# Patient Record
Sex: Female | Born: 1965 | Race: Black or African American | Hispanic: No | Marital: Single | Smoking: Current every day smoker
Health system: Southern US, Community
[De-identification: ages and names within clinical notes are randomized; demographics above are authoritative.]

## PROBLEM LIST (undated history)

## (undated) DIAGNOSIS — D649 Anemia, unspecified: Secondary | ICD-10-CM

## (undated) HISTORY — PX: TUBAL LIGATION: SHX77

---

## 2005-08-04 ENCOUNTER — Emergency Department (HOSPITAL_COMMUNITY): Admission: EM | Admit: 2005-08-04 | Discharge: 2005-08-04 | Payer: Self-pay | Admitting: Emergency Medicine

## 2006-04-14 ENCOUNTER — Emergency Department (HOSPITAL_COMMUNITY): Admission: EM | Admit: 2006-04-14 | Discharge: 2006-04-14 | Payer: Self-pay | Admitting: Emergency Medicine

## 2007-09-20 ENCOUNTER — Emergency Department (HOSPITAL_COMMUNITY): Admission: EM | Admit: 2007-09-20 | Discharge: 2007-09-20 | Payer: Self-pay | Admitting: Emergency Medicine

## 2008-07-24 ENCOUNTER — Encounter: Payer: Self-pay | Admitting: Emergency Medicine

## 2008-07-24 ENCOUNTER — Inpatient Hospital Stay (HOSPITAL_COMMUNITY): Admission: AD | Admit: 2008-07-24 | Discharge: 2008-07-26 | Payer: Self-pay | Admitting: Internal Medicine

## 2009-10-31 ENCOUNTER — Emergency Department (HOSPITAL_COMMUNITY): Admission: EM | Admit: 2009-10-31 | Discharge: 2009-10-31 | Payer: Self-pay | Admitting: Emergency Medicine

## 2010-02-11 ENCOUNTER — Encounter (INDEPENDENT_AMBULATORY_CARE_PROVIDER_SITE_OTHER): Payer: Self-pay | Admitting: *Deleted

## 2010-02-12 ENCOUNTER — Ambulatory Visit: Payer: Self-pay | Admitting: Gastroenterology

## 2010-02-12 DIAGNOSIS — R198 Other specified symptoms and signs involving the digestive system and abdomen: Secondary | ICD-10-CM | POA: Insufficient documentation

## 2010-02-12 DIAGNOSIS — K625 Hemorrhage of anus and rectum: Secondary | ICD-10-CM

## 2010-02-12 DIAGNOSIS — K5909 Other constipation: Secondary | ICD-10-CM

## 2010-02-25 ENCOUNTER — Ambulatory Visit: Payer: Self-pay | Admitting: Gastroenterology

## 2010-02-25 ENCOUNTER — Ambulatory Visit (HOSPITAL_COMMUNITY): Admission: RE | Admit: 2010-02-25 | Discharge: 2010-02-25 | Payer: Self-pay | Admitting: Gastroenterology
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
## 2010-06-02 NOTE — Letter (Signed)
Summary: Scheduled Appointment  [facility]  [address]   [place], [place] [address]   Phone: [phone]  Fax: [phone]    February 11, 2010   Dear: Wendy French            DOB: Jun 02, 1965    I have been instructed to schedule you an appointment in our office.  Your appointment is as follows:   Date: THURSDAY 02/12/10   Time: 2: 30 PM Please be here 15 minutes early.       Provider: Tana Coast, PA-C    Please contact the office if you need to reschedule this appointment for a more convenient time.   Thank you,    Rexene Alberts       [facility] Ph: [phone]   Fax: [phone]

## 2010-06-02 NOTE — Letter (Signed)
Summary: TCS ORDER  TCS ORDER   Imported By: Ave Filter 02/12/2010 16:16:34  _____________________________________________________________________  External Attachment:    Type:   Image     Comment:   External Document
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
## 2010-07-15 LAB — GLUCOSE, CAPILLARY: Glucose-Capillary: 126 mg/dL — ABNORMAL HIGH (ref 70–99)

## 2010-08-13 LAB — BASIC METABOLIC PANEL
BUN: 5 mg/dL — ABNORMAL LOW (ref 6–23)
BUN: 8 mg/dL (ref 6–23)
Chloride: 106 mEq/L (ref 96–112)
GFR calc Af Amer: >60 mL/min (ref >60)
GFR calc Af Amer: >60 mL/min (ref >60)
GFR calc Af Amer: >60 mL/min (ref >60)
GFR calc non Af Amer: >60 mL/min (ref >60)
GFR calc non Af Amer: >60 mL/min (ref >60)
Potassium: 4.1 mEq/L (ref 3.5–5.1)
Potassium: 4.1 mEq/L (ref 3.5–5.1)
Sodium: 136 mEq/L (ref 135–145)

## 2010-08-13 LAB — CBC
HCT: 30.8 % — ABNORMAL LOW (ref 36.0–46.0)
Hemoglobin: 9.3 g/dL — ABNORMAL LOW (ref 12.0–15.0)
Hemoglobin: 9.8 g/dL — ABNORMAL LOW (ref 12.0–15.0)
MCHC: 32.6 g/dL (ref 30.0–36.0)
MCHC: 31.9 g/dL (ref 30.0–36.0)
MCV: 73.4 fL — ABNORMAL LOW (ref 78.0–100.0)
MCV: 74.7 fL — ABNORMAL LOW (ref 78.0–100.0)
Platelets: 364 10*3/uL (ref 150–400)
Platelets: 282 10*3/uL (ref 150–400)
RBC: 3.86 MIL/uL — ABNORMAL LOW (ref 3.87–5.11)
RBC: 4.12 MIL/uL (ref 3.87–5.11)
WBC: 8.4 10*3/uL (ref 4.0–10.5)
WBC: 6.4 10*3/uL (ref 4.0–10.5)

## 2010-08-13 LAB — CULTURE, BLOOD (ROUTINE X 2)
Culture: NO GROWTH
Culture: NO GROWTH

## 2010-08-13 LAB — HEMOGLOBIN A1C: Hgb A1c MFr Bld: 7.3 % — ABNORMAL HIGH (ref 4.6–6.1)

## 2010-08-13 LAB — GLUCOSE, CAPILLARY
Glucose-Capillary: 119 mg/dL — ABNORMAL HIGH (ref 70–99)
Glucose-Capillary: 142 mg/dL — ABNORMAL HIGH (ref 70–99)
Glucose-Capillary: 149 mg/dL — ABNORMAL HIGH (ref 70–99)
Glucose-Capillary: 153 mg/dL — ABNORMAL HIGH (ref 70–99)
Glucose-Capillary: 170 mg/dL — ABNORMAL HIGH (ref 70–99)

## 2010-08-13 LAB — HIV ANTIBODY (ROUTINE TESTING W REFLEX): HIV: NONREACTIVE

## 2010-08-13 LAB — DIFFERENTIAL
Basophils Relative: 0 % (ref 0–1)
Eosinophils Absolute: 0.3 10*3/uL (ref 0.0–0.7)
Eosinophils Relative: 3 % (ref 0–5)
Monocytes Relative: 3 % (ref 3–12)
Neutrophils Relative %: 66 % (ref 43–77)

## 2010-08-13 LAB — PREGNANCY, URINE: Preg Test, Ur: NEGATIVE

## 2010-08-13 LAB — PROTIME-INR
INR: 1 (ref 0.00–1.49)
Prothrombin Time: 13.1 seconds (ref 11.6–15.2)

## 2010-09-11 ENCOUNTER — Emergency Department (HOSPITAL_COMMUNITY)
Admission: EM | Admit: 2010-09-11 | Discharge: 2010-09-11 | Disposition: A | Discharge status: Home / Self Care | Payer: Medicaid Other | Attending: Emergency Medicine | Admitting: Emergency Medicine

## 2010-09-11 DIAGNOSIS — R51 Headache: Secondary | ICD-10-CM | POA: Insufficient documentation

## 2010-09-11 DIAGNOSIS — L708 Other acne: Secondary | ICD-10-CM | POA: Insufficient documentation

## 2010-09-11 DIAGNOSIS — L659 Nonscarring hair loss, unspecified: Secondary | ICD-10-CM | POA: Insufficient documentation
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
## 2011-01-27 LAB — CBC
Hemoglobin: 9.1 — ABNORMAL LOW
MCV: 78.3
Platelets: 286
RDW: 15.6 — ABNORMAL HIGH

## 2011-01-27 LAB — HCG, QUANTITATIVE, PREGNANCY: hCG, Beta Chain, Quant, S: <2

## 2012-01-10 ENCOUNTER — Emergency Department (HOSPITAL_COMMUNITY)
Admission: EM | Admit: 2012-01-10 | Discharge: 2012-01-10 | Discharge status: Against Medical Advice | Payer: Medicaid Other | Attending: Emergency Medicine | Admitting: Emergency Medicine

## 2012-01-10 ENCOUNTER — Encounter (HOSPITAL_COMMUNITY): Payer: Self-pay | Admitting: *Deleted

## 2012-01-10 DIAGNOSIS — K5900 Constipation, unspecified: Secondary | ICD-10-CM | POA: Insufficient documentation

## 2012-01-10 NOTE — ED Notes (Signed)
No bm in 3 days, took stool softner and gave herself an enema with achildren's bulb syringe, and caused bleeding.

## 2012-01-10 NOTE — ED Notes (Signed)
The patient states she had a bowel movement and no longer wishes treatment, ama form signed

## 2015-01-27 ENCOUNTER — Encounter: Payer: Self-pay | Admitting: Gastroenterology

## 2015-02-06 ENCOUNTER — Ambulatory Visit (INDEPENDENT_AMBULATORY_CARE_PROVIDER_SITE_OTHER): Payer: Medicaid Other | Admitting: Otolaryngology

## 2015-02-12 ENCOUNTER — Ambulatory Visit: Payer: Medicaid Other | Admitting: Gastroenterology

## 2015-02-12 ENCOUNTER — Encounter: Payer: Self-pay | Admitting: Gastroenterology

## 2015-02-12 ENCOUNTER — Telehealth: Payer: Self-pay | Admitting: Gastroenterology

## 2015-02-12 NOTE — Telephone Encounter (Signed)
PATIENT WAS A NO SHOW AND LETTER SENT  °

## 2015-03-06 ENCOUNTER — Ambulatory Visit (INDEPENDENT_AMBULATORY_CARE_PROVIDER_SITE_OTHER): Payer: Medicaid Other | Admitting: Otolaryngology

## 2015-07-21 ENCOUNTER — Encounter: Payer: Medicaid Other | Admitting: Obstetrics & Gynecology

## 2016-05-11 ENCOUNTER — Encounter (HOSPITAL_COMMUNITY): Payer: Self-pay | Admitting: Emergency Medicine

## 2016-05-11 ENCOUNTER — Emergency Department (HOSPITAL_COMMUNITY)
Admission: EM | Admit: 2016-05-11 | Discharge: 2016-05-11 | Disposition: A | Discharge status: Home / Self Care | Payer: Self-pay | Attending: Emergency Medicine | Admitting: Emergency Medicine

## 2016-05-11 ENCOUNTER — Emergency Department (HOSPITAL_COMMUNITY): Payer: Self-pay

## 2016-05-11 DIAGNOSIS — E119 Type 2 diabetes mellitus without complications: Secondary | ICD-10-CM | POA: Insufficient documentation

## 2016-05-11 DIAGNOSIS — Z79899 Other long term (current) drug therapy: Secondary | ICD-10-CM | POA: Insufficient documentation

## 2016-05-11 DIAGNOSIS — M7989 Other specified soft tissue disorders: Secondary | ICD-10-CM | POA: Insufficient documentation

## 2016-05-11 DIAGNOSIS — R079 Chest pain, unspecified: Secondary | ICD-10-CM | POA: Insufficient documentation

## 2016-05-11 DIAGNOSIS — Z7984 Long term (current) use of oral hypoglycemic drugs: Secondary | ICD-10-CM | POA: Insufficient documentation

## 2016-05-11 DIAGNOSIS — F17200 Nicotine dependence, unspecified, uncomplicated: Secondary | ICD-10-CM | POA: Insufficient documentation

## 2016-05-11 HISTORY — DX: Anemia, unspecified: D64.9

## 2016-05-11 LAB — BASIC METABOLIC PANEL
Anion gap: 5 (ref 5–15)
BUN: 8 mg/dL (ref 6–20)
CO2: 28 mmol/L (ref 22–32)
Calcium: 8.9 mg/dL (ref 8.9–10.3)
Chloride: 104 mmol/L (ref 101–111)
Creatinine, Ser: 0.66 mg/dL (ref 0.44–1.00)
GFR calc Af Amer: >60 mL/min (ref >60)
GFR calc non Af Amer: >60 mL/min (ref >60)
Glucose, Bld: 81 mg/dL (ref 65–99)
Potassium: 3.8 mmol/L (ref 3.5–5.1)
Sodium: 137 mmol/L (ref 135–145)

## 2016-05-11 LAB — CBC
HCT: 28.5 % — ABNORMAL LOW (ref 36.0–46.0)
Hemoglobin: 8.7 g/dL — ABNORMAL LOW (ref 12.0–15.0)
MCH: 21.7 pg — ABNORMAL LOW (ref 26.0–34.0)
MCHC: 30.5 g/dL (ref 30.0–36.0)
MCV: 71.1 fL — ABNORMAL LOW (ref 78.0–100.0)
Platelets: 266 10*3/uL (ref 150–400)
RBC: 4.01 MIL/uL (ref 3.87–5.11)
RDW: 21.5 % — ABNORMAL HIGH (ref 11.5–15.5)
WBC: 5.4 10*3/uL (ref 4.0–10.5)

## 2016-05-11 LAB — TROPONIN I: Troponin I: <0.03 ng/mL (ref <0.03)

## 2016-05-11 MED ORDER — LORAZEPAM 2 MG/ML IJ SOLN
0.5000 mg | Freq: Once | INTRAMUSCULAR | Status: AC
  Administered 2016-05-11: 0.5 mg via INTRAVENOUS
  Filled 2016-05-11: qty 1

## 2016-05-11 MED ORDER — KETOROLAC TROMETHAMINE 30 MG/ML IJ SOLN
15.0000 mg | Freq: Once | INTRAMUSCULAR | Status: AC
  Administered 2016-05-11: 15 mg via INTRAVENOUS
  Filled 2016-05-11: qty 1

## 2016-05-11 MED ORDER — MELOXICAM 7.5 MG PO TABS: 7.5000 mg | ORAL_TABLET | Freq: Every day | ORAL | 0 refills | Status: DC | PRN

## 2016-05-11 NOTE — ED Triage Notes (Addendum)
Patient complaining of right sided chest pain "for about 3 months." States "it hurts when I take a deep breath especially when I smoke." Denies pain at triage.

## 2016-05-11 NOTE — ED Notes (Signed)
ED Provider at bedside. 

## 2016-05-11 NOTE — ED Provider Notes (Signed)
[facility] DEPT Provider Note   CSN: [id number] Arrival date & time: 05/11/16  1008   By signing my name below, I, Cynda Acres, attest that this documentation has been prepared under the direction and in the presence of Raeford Razor, MD. Electronically Signed: Cynda Acres, Scribe. 05/11/16. 11:41 AM.   History   Chief Complaint Chief Complaint  Patient presents with  . Chest Pain    HPI Comments: Wendy French is a 51 y.o. female hx of DM and tobacco abuse, who presents to the Emergency Department complaining of intermittent right-sided chest pain that began 3 months ago. Patient states her episodes last about an hour, she has to go lay down until pain resolves. She has associated lower extremity edema. Patient states it hurts worse with deep breathing, especially when smoking. Patient describes the pain as "crampy". No modifying factors indicated. She denies any pain when coughing, laughing, or sneezing, no back pain, fever, chills, or night sweats.   The history is provided by the patient. No language interpreter was used.    Past Medical History:  Diagnosis Date  . Anemia   . Diabetes mellitus     Patient Active Problem List   Diagnosis Date Noted  . CONSTIPATION, CHRONIC 02/12/2010  . RECTAL BLEEDING 02/12/2010  . RECTAL MASS 02/12/2010    Past Surgical History:  Procedure Laterality Date  . TUBAL LIGATION      OB History    No data available       Home Medications    Prior to Admission medications   Not on File    Family History History reviewed. No pertinent family history.  Social History Social History  Substance Use Topics  . Smoking status: Current Every Day Smoker  . Smokeless tobacco: Never Used  . Alcohol use Yes     Comment: "about 2 beers every other day"     Allergies   Patient has no known allergies.   Review of Systems Review of Systems  Constitutional: Negative for chills and diaphoresis.  Cardiovascular:  Positive for chest pain and leg swelling.  Musculoskeletal: Negative for back pain.  All other systems reviewed and are negative.    Physical Exam Updated Vital Signs BP 131/90 (BP Location: Left Arm)   Pulse 83   Temp 97.3 F (36.3 C) (Temporal)   Resp 16   Ht 5\' 3"  (1.6 m)   Wt 205 lb (93 kg)   LMP 05/04/2016   SpO2 100%   BMI 36.31 kg/m   Physical Exam  Constitutional: She is oriented to person, place, and time. She appears well-developed and well-nourished. No distress.  HENT:  Head: Normocephalic and atraumatic.  Eyes: EOM are normal.  Neck: Normal range of motion.  Cardiovascular: Normal rate, regular rhythm and normal heart sounds.   Pulmonary/Chest: Effort normal and breath sounds normal.  Abdominal: Soft. She exhibits no distension. There is no tenderness.  Musculoskeletal: Normal range of motion.  Neurological: She is alert and oriented to person, place, and time.  Skin: Skin is warm and dry.  Psychiatric: She has a normal mood and affect. Judgment normal.  Nursing note and vitals reviewed.    ED Treatments / Results  DIAGNOSTIC STUDIES: Oxygen Saturation is 100% on RA, normal by my interpretation.    COORDINATION OF CARE: 11:40 AM Discussed treatment plan with pt at bedside and pt agreed to plan.  Labs (all labs ordered are listed, but only abnormal results are displayed) Labs Reviewed  CBC - Abnormal; Notable  for the following:       Result Value   Hemoglobin 8.7 (*)    HCT 28.5 (*)    MCV 71.1 (*)    MCH 21.7 (*)    RDW 21.5 (*)    All other components within normal limits  BASIC METABOLIC PANEL  TROPONIN I    EKG  EKG Interpretation  Date/Time:  Tuesday May 11 2016 11:10:05 EST Ventricular Rate:  70 PR Interval:    QRS Duration: 84 QT Interval:  390 QTC Calculation: 421 R Axis:   22 Text Interpretation:  Sinus rhythm Anterior infarct, old Non-specific ST-t changes No old tracing to compare Confirmed by Emannuel Vise  MD, Oneita Allmon [phone]  on 05/11/2016 11:50:07 AM       Radiology No results found.   Dg Chest 2 View  Result Date: 05/11/2016 CLINICAL DATA:  Intermittent right-sided chest pain that began 3 months ago. Also lower extremity edema. History of diabetes, current smoker. EXAM: CHEST  2 VIEW COMPARISON:  PA and lateral chest x-ray of July 24, 2008 FINDINGS: The lungs are well-expanded. The interstitial markings are mildly increased overall as compared to the previous study. The heart is top-normal in size. The pulmonary vascularity is not engorged. The mediastinum is normal in width. There are stable calcifications in the hilar regions. The bony thorax exhibits no acute abnormality. There are deformities of the posterior aspects of the right fifth and sixth ribs which have appeared since the previous study likely reflect previous fractures. IMPRESSION: Mild interstitial prominence more conspicuous than in the past may reflect chronic bronchitic change. There is no alveolar pneumonia nor CHF. There are fractures of the posterior aspects of the right fifth and sixth ribs which have appeared since the 2010 study but appear old. Electronically Signed   By: David  SwazilandJordan M.D.   On: 05/11/2016 11:59    Procedures Procedures (including critical care time)  Medications Ordered in ED Medications - No data to display   Initial Impression / Assessment and Plan / ED Course  I have reviewed the triage vital signs and the nursing notes.  Pertinent labs & imaging results that were available during my care of the patient were reviewed by me and considered in my medical decision making (see chart for details).  Clinical Course     50yF with CP. Doubt ACE, PE, dissection or other emergent process.  Final Clinical Impressions(s) / ED Diagnoses   Final diagnoses:  Chest pain, unspecified type    New Prescriptions New Prescriptions   No medications on file   I personally preformed the services scribed in my presence. The  recorded information has been reviewed is accurate. Raeford RazorStephen Dajanae Brophy, MD.     Raeford RazorStephen Daila Elbert, MD 05/14/16 [phone]

## 2017-01-15 ENCOUNTER — Encounter (HOSPITAL_COMMUNITY): Payer: Self-pay | Admitting: Emergency Medicine

## 2017-01-15 ENCOUNTER — Emergency Department (HOSPITAL_COMMUNITY)
Admission: EM | Admit: 2017-01-15 | Discharge: 2017-01-15 | Disposition: A | Discharge status: Home / Self Care | Payer: Self-pay | Attending: Emergency Medicine | Admitting: Emergency Medicine

## 2017-01-15 DIAGNOSIS — R252 Cramp and spasm: Secondary | ICD-10-CM | POA: Insufficient documentation

## 2017-01-15 DIAGNOSIS — M79604 Pain in right leg: Secondary | ICD-10-CM | POA: Insufficient documentation

## 2017-01-15 DIAGNOSIS — F17200 Nicotine dependence, unspecified, uncomplicated: Secondary | ICD-10-CM | POA: Insufficient documentation

## 2017-01-15 DIAGNOSIS — E119 Type 2 diabetes mellitus without complications: Secondary | ICD-10-CM | POA: Insufficient documentation

## 2017-01-15 DIAGNOSIS — Z7984 Long term (current) use of oral hypoglycemic drugs: Secondary | ICD-10-CM | POA: Insufficient documentation

## 2017-01-15 LAB — BASIC METABOLIC PANEL
ANION GAP: 7 (ref 5–15)
BUN: 11 mg/dL (ref 6–20)
CALCIUM: 8.4 mg/dL — AB (ref 8.9–10.3)
CO2: 23 mmol/L (ref 22–32)
Chloride: 112 mmol/L — ABNORMAL HIGH (ref 101–111)
Creatinine, Ser: 0.52 mg/dL (ref 0.44–1.00)
GFR calc Af Amer: >60 mL/min (ref >60)
GLUCOSE: 136 mg/dL — AB (ref 65–99)
Potassium: 3.9 mmol/L (ref 3.5–5.1)
Sodium: 142 mmol/L (ref 135–145)

## 2017-01-15 LAB — MAGNESIUM: Magnesium: 1.7 mg/dL (ref 1.7–2.4)

## 2017-01-15 LAB — CK: Total CK: 108 U/L (ref 38–234)

## 2017-01-15 MED ORDER — METHOCARBAMOL 500 MG PO TABS
1000.0000 mg | ORAL_TABLET | Freq: Once | ORAL | Status: AC
  Administered 2017-01-15: 1000 mg via ORAL
  Filled 2017-01-15: qty 2

## 2017-01-15 MED ORDER — METHOCARBAMOL 500 MG PO TABS: 500.0000 mg | ORAL_TABLET | Freq: Every evening | ORAL | 0 refills | Status: AC | PRN

## 2017-01-15 NOTE — ED Triage Notes (Signed)
Patient c/o bilateral "leg cramping" x1 week. Patient states "When I lay down I start getting cramps in my legs. They'll go away but come right when I try to lay down." Patient states that she has tried to increase potassium but no relief.

## 2017-01-15 NOTE — Discharge Instructions (Signed)
Follow-up with your primary care physician.  Drink plenty of fluids. Robaxin prior to bed for muscle pain and cramps

## 2017-01-15 NOTE — ED Provider Notes (Addendum)
[facility] Provider Note   CSN: [id number] Arrival date & time: 01/15/17  1727     History   Chief Complaint Chief Complaint  Patient presents with  . Leg Pain    HPI Wendy French is a 51 y.o. female. Chief complaint is leg cramps HPI:  51 year old female. Spinning-like cramps the last 2 weeks. Started a job where she is on her feet a lot. She feels like she hydrates well. Does not take a statin. No daytime pain. Simply cramps during the night. Previously was on potassium for low potassium levels.  Past Medical History:  Diagnosis Date  . Anemia   . Diabetes mellitus     Patient Active Problem List   Diagnosis Date Noted  . CONSTIPATION, CHRONIC 02/12/2010  . RECTAL BLEEDING 02/12/2010  . RECTAL MASS 02/12/2010    Past Surgical History:  Procedure Laterality Date  . TUBAL LIGATION      OB History    No data available       Home Medications    Prior to Admission medications   Medication Sig Start Date End Date Taking? Authorizing Provider  metFORMIN (GLUCOPHAGE) 1000 MG tablet Take 1,000 mg by mouth 2 (two) times daily with a meal.   Yes [provider]  methocarbamol (ROBAXIN) 500 MG tablet Take 1 tablet (500 mg total) by mouth at bedtime as needed for muscle spasms. 01/15/17   Rolland Porter, MD    Family History No family history on file.  Social History Social History  Substance Use Topics  . Smoking status: Current Every Day Smoker  . Smokeless tobacco: Never Used  . Alcohol use Yes     Comment: "about 2 beers every other day"     Allergies   Patient has no known allergies.   Review of Systems Review of Systems  Constitutional: Negative for appetite change, chills, diaphoresis, fatigue and fever.  HENT: Negative for mouth sores, sore throat and trouble swallowing.   Eyes: Negative for visual disturbance.  Respiratory: Negative for cough, chest tightness, shortness of breath and wheezing.   Cardiovascular: Negative for  chest pain.  Gastrointestinal: Negative for abdominal distention, abdominal pain, diarrhea, nausea and vomiting.  Endocrine: Negative for polydipsia, polyphagia and polyuria.  Genitourinary: Negative for dysuria, frequency and hematuria.  Musculoskeletal: Positive for myalgias. Negative for gait problem.  Skin: Negative for color change, pallor and rash.  Neurological: Negative for dizziness, syncope, light-headedness and headaches.  Hematological: Does not bruise/bleed easily.  Psychiatric/Behavioral: Negative for behavioral problems and confusion.     Physical Exam Updated Vital Signs BP (!) 126/56 (BP Location: Left Arm)   Pulse 87   Temp 98 F (36.7 C) (Oral)   Resp 18   Ht  (1.6 m)   Wt 81.6 kg (180 lb)   SpO2 100%   BMI 31.89 kg/m   Physical Exam  Constitutional: She is oriented to person, place, and time. She appears well-developed and well-nourished. No distress.  HENT:  Head: Normocephalic.  Eyes: Pupils are equal, round, and reactive to light. Conjunctivae are normal. No scleral icterus.  Neck: Normal range of motion. Neck supple. No thyromegaly present.  Cardiovascular: Normal rate and regular rhythm.  Exam reveals no gallop and no friction rub.   No murmur heard. Pulmonary/Chest: Effort normal and breath sounds normal. No respiratory distress. She has no wheezes. She has no rales.  Abdominal: Soft. Bowel sounds are normal. She exhibits no distension. There is no tenderness. There is no rebound.  Musculoskeletal: Normal range of motion.  Mild diffuse thigh and calf pain. No lower extremity edema. Normal sensation.  Neurological: She is alert and oriented to person, place, and time.  Skin: Skin is warm and dry. No rash noted.  Psychiatric: She has a normal mood and affect. Her behavior is normal.     ED Treatments / Results  Labs (all labs ordered are listed, but only abnormal results are displayed) Labs Reviewed  BASIC METABOLIC PANEL - Abnormal; Notable  for the following:       Result Value   Chloride 112 (*)    Glucose, Bld 136 (*)    Calcium 8.4 (*)    All other components within normal limits  MAGNESIUM  CK    EKG  EKG Interpretation None       Radiology No results found.  Procedures Procedures (including critical care time)  Medications Ordered in ED Medications  methocarbamol (ROBAXIN) tablet 1,000 mg (not administered)     Initial Impression / Assessment and Plan / ED Course  I have reviewed the triage vital signs and the nursing notes.  Pertinent labs & imaging results that were available during my care of the patient were reviewed by me and considered in my medical decision making (see chart for details).     CK normal. Normal likewise including magnesium and potassium. Plan stretching, continue BASIC exercise. Hydration. Primary care follow-up. Robaxin daily at bedtime.  Final Clinical Impressions(s) / ED Diagnoses   Final diagnoses:  Leg cramps    New Prescriptions New Prescriptions   METHOCARBAMOL (ROBAXIN) 500 MG TABLET    Take 1 tablet (500 mg total) by mouth at bedtime as needed for muscle spasms.     Rolland Porter, MD 01/15/17 [name]    Rolland Porter, MD 01/15/17 2010

## 2017-01-15 NOTE — ED Notes (Signed)
Pt ambulatory to waiting room. Pt verbalized understanding of discharge instructions.   

## 2021-11-27 ENCOUNTER — Other Ambulatory Visit: Payer: Self-pay

## 2021-11-27 DIAGNOSIS — Z1231 Encounter for screening mammogram for malignant neoplasm of breast: Secondary | ICD-10-CM

## 2021-12-01 ENCOUNTER — Ambulatory Visit
Admission: RE | Admit: 2021-12-01 | Discharge: 2021-12-01 | Disposition: A | Discharge status: Home / Self Care | Payer: Medicaid Other | Source: Ambulatory Visit | Attending: Obstetrics and Gynecology | Admitting: Obstetrics and Gynecology

## 2021-12-01 ENCOUNTER — Encounter: Payer: Self-pay | Admitting: *Deleted

## 2021-12-01 ENCOUNTER — Ambulatory Visit: Payer: Medicaid Other | Attending: Hematology and Oncology | Admitting: *Deleted

## 2021-12-01 VITALS — BP 132/78 | Wt 199.2 lb

## 2021-12-01 DIAGNOSIS — Z01419 Encounter for gynecological examination (general) (routine) without abnormal findings: Secondary | ICD-10-CM

## 2021-12-01 DIAGNOSIS — Z1231 Encounter for screening mammogram for malignant neoplasm of breast: Secondary | ICD-10-CM | POA: Insufficient documentation

## 2021-12-01 NOTE — Progress Notes (Signed)
[name] is a 56 y.o. female who presents to [facility] clinic today with complaint of bilateral breast tenderness.  States she has always had breast tenderness.  She presents to today for clinical breast exam and pap smear.      Pap Smear: Pap not smear completed today. Last Pap smear was 03/08/16 at [facility] clinic and was normal. Per patient has history of an abnormal Pap smear. Last Pap smear result is not available in Epic.  Patient has a history of a total hysterectomy for fibroids on 12/06/18 at [facility].   Physical exam: Breasts Breasts symmetrical. Patient is very tender to palpation and is very guarding of her breast.  Difficult exam to complete due to the guarding.  No skin abnormalities bilateral breasts. No nipple retraction bilateral breasts. No nipple discharge bilateral breasts. No lymphadenopathy. No lumps palpated bilateral breasts.       Pelvic/Bimanual Pap is not indicated today    Smoking History: Patient  is a current smoker.    Flyer to be mailed for quit line by [employer], LPN.    Patient Navigation: Patient education provided. Taught self breast awareness.  Access to services provided for patient through [facility] program. No  interpreter provided. No transportation provided   Colorectal Cancer Screening: Per patient she has had a benign colonoscopy in 2015.  States she will schedule her next colonoscopy.   No complaints today.    Breast and Cervical Cancer Risk Assessment: Patient does not have family history of breast cancer, known genetic mutations, or radiation treatment to the chest before age 55. Patient does not have history of cervical dysplasia, immunocompromised, or DES exposure in-utero.  Risk Assessment     Risk Scores       12/01/2021   Last edited by: Narda Rutherford, LPN   5-year risk: 1.3 %   Lifetime risk: 8 %            A: [facility] exam without pap smear Complaint of bilateral breast tenderness.  P: Referred patient to the [facility] for a screening mammogram. Appointment scheduled today.  Jim Like, RN 12/01/2021 3:39 PM

## 2021-12-02 ENCOUNTER — Inpatient Hospital Stay
Admission: RE | Admit: 2021-12-02 | Discharge: 2021-12-02 | Disposition: A | Discharge status: Home / Self Care | Payer: Self-pay | Source: Ambulatory Visit | Attending: *Deleted | Admitting: *Deleted

## 2021-12-02 ENCOUNTER — Other Ambulatory Visit: Payer: Self-pay | Admitting: *Deleted

## 2021-12-02 DIAGNOSIS — Z1231 Encounter for screening mammogram for malignant neoplasm of breast: Secondary | ICD-10-CM

## 2023-01-10 ENCOUNTER — Encounter: Payer: Self-pay | Admitting: Cardiology

## 2023-01-10 DIAGNOSIS — Z1231 Encounter for screening mammogram for malignant neoplasm of breast: Secondary | ICD-10-CM
